# Patient Record
Sex: Female | Born: 1965 | Race: Black or African American | Hispanic: No | Marital: Married | Smoking: Never smoker
Health system: Southern US, Community
[De-identification: ages and names within clinical notes are randomized; demographics above are authoritative.]

## PROBLEM LIST (undated history)

## (undated) DIAGNOSIS — E119 Type 2 diabetes mellitus without complications: Secondary | ICD-10-CM

## (undated) DIAGNOSIS — A6000 Herpesviral infection of urogenital system, unspecified: Secondary | ICD-10-CM

## (undated) DIAGNOSIS — F329 Major depressive disorder, single episode, unspecified: Secondary | ICD-10-CM

## (undated) DIAGNOSIS — F988 Other specified behavioral and emotional disorders with onset usually occurring in childhood and adolescence: Secondary | ICD-10-CM

## (undated) DIAGNOSIS — E669 Obesity, unspecified: Secondary | ICD-10-CM

## (undated) DIAGNOSIS — F32A Depression, unspecified: Secondary | ICD-10-CM

## (undated) DIAGNOSIS — R7303 Prediabetes: Secondary | ICD-10-CM

## (undated) DIAGNOSIS — R011 Cardiac murmur, unspecified: Secondary | ICD-10-CM

## (undated) DIAGNOSIS — F419 Anxiety disorder, unspecified: Secondary | ICD-10-CM

## (undated) HISTORY — PX: COLONOSCOPY: SHX174

## (undated) HISTORY — PX: ABLATION: SHX5711

---
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
## 2006-01-13 ENCOUNTER — Ambulatory Visit: Payer: Self-pay

## 2008-01-31 ENCOUNTER — Ambulatory Visit: Payer: Self-pay | Admitting: Nurse Practitioner

## 2009-04-05 ENCOUNTER — Ambulatory Visit: Payer: Self-pay | Admitting: Nurse Practitioner

## 2011-01-26 ENCOUNTER — Emergency Department: Payer: Self-pay | Admitting: Emergency Medicine

## 2011-10-17 ENCOUNTER — Ambulatory Visit: Payer: Self-pay | Admitting: Internal Medicine

## 2012-01-22 ENCOUNTER — Emergency Department: Payer: Self-pay | Admitting: Emergency Medicine

## 2012-06-15 ENCOUNTER — Ambulatory Visit: Payer: Self-pay | Admitting: Internal Medicine

## 2013-12-27 ENCOUNTER — Ambulatory Visit: Payer: Self-pay | Admitting: Family Medicine

## 2017-04-09 ENCOUNTER — Other Ambulatory Visit: Payer: Self-pay | Admitting: Family Medicine

## 2017-04-09 DIAGNOSIS — Z1231 Encounter for screening mammogram for malignant neoplasm of breast: Secondary | ICD-10-CM

## 2017-04-15 ENCOUNTER — Encounter: Payer: Self-pay | Admitting: Radiology

## 2017-04-15 ENCOUNTER — Ambulatory Visit
Admission: RE | Admit: 2017-04-15 | Discharge: 2017-04-15 | Disposition: A | Discharge status: Home / Self Care | Payer: BC Managed Care – PPO | Source: Ambulatory Visit | Attending: Family Medicine | Admitting: Family Medicine

## 2017-04-15 DIAGNOSIS — Z1231 Encounter for screening mammogram for malignant neoplasm of breast: Secondary | ICD-10-CM | POA: Diagnosis not present

## 2018-03-08 ENCOUNTER — Other Ambulatory Visit: Payer: Self-pay | Admitting: Family Medicine

## 2019-06-08 ENCOUNTER — Other Ambulatory Visit: Payer: Self-pay | Admitting: Family Medicine

## 2019-06-08 DIAGNOSIS — Z1231 Encounter for screening mammogram for malignant neoplasm of breast: Secondary | ICD-10-CM

## 2019-06-16 ENCOUNTER — Other Ambulatory Visit
Admission: RE | Admit: 2019-06-16 | Discharge: 2019-06-16 | Disposition: A | Discharge status: Home / Self Care | Payer: BC Managed Care – PPO | Source: Ambulatory Visit | Attending: Internal Medicine | Admitting: Internal Medicine

## 2019-06-16 ENCOUNTER — Other Ambulatory Visit: Payer: Self-pay

## 2019-06-16 DIAGNOSIS — Z20822 Contact with and (suspected) exposure to covid-19: Secondary | ICD-10-CM | POA: Insufficient documentation

## 2019-06-16 DIAGNOSIS — Z01812 Encounter for preprocedural laboratory examination: Secondary | ICD-10-CM | POA: Diagnosis present

## 2019-06-16 LAB — SARS CORONAVIRUS 2 (TAT 6-24 HRS): SARS Coronavirus 2: NEGATIVE

## 2019-06-17 ENCOUNTER — Encounter: Payer: Self-pay | Admitting: Internal Medicine

## 2019-06-20 ENCOUNTER — Ambulatory Visit: Payer: BC Managed Care – PPO | Admitting: Anesthesiology

## 2019-06-20 ENCOUNTER — Encounter
Admission: RE | Disposition: A | Discharge status: Home / Self Care | Payer: Self-pay | Source: Home / Self Care | Attending: Internal Medicine

## 2019-06-20 ENCOUNTER — Encounter: Payer: Self-pay | Admitting: Internal Medicine

## 2019-06-20 ENCOUNTER — Ambulatory Visit
Admission: RE | Admit: 2019-06-20 | Discharge: 2019-06-20 | Disposition: A | Discharge status: Home / Self Care | Payer: BC Managed Care – PPO | Attending: Internal Medicine | Admitting: Internal Medicine

## 2019-06-20 DIAGNOSIS — R011 Cardiac murmur, unspecified: Secondary | ICD-10-CM | POA: Diagnosis not present

## 2019-06-20 DIAGNOSIS — Z791 Long term (current) use of non-steroidal anti-inflammatories (NSAID): Secondary | ICD-10-CM | POA: Diagnosis not present

## 2019-06-20 DIAGNOSIS — F988 Other specified behavioral and emotional disorders with onset usually occurring in childhood and adolescence: Secondary | ICD-10-CM | POA: Insufficient documentation

## 2019-06-20 DIAGNOSIS — E669 Obesity, unspecified: Secondary | ICD-10-CM | POA: Insufficient documentation

## 2019-06-20 DIAGNOSIS — F419 Anxiety disorder, unspecified: Secondary | ICD-10-CM | POA: Insufficient documentation

## 2019-06-20 DIAGNOSIS — E119 Type 2 diabetes mellitus without complications: Secondary | ICD-10-CM | POA: Diagnosis not present

## 2019-06-20 DIAGNOSIS — Z1211 Encounter for screening for malignant neoplasm of colon: Secondary | ICD-10-CM | POA: Diagnosis not present

## 2019-06-20 DIAGNOSIS — K635 Polyp of colon: Secondary | ICD-10-CM | POA: Diagnosis not present

## 2019-06-20 DIAGNOSIS — K640 First degree hemorrhoids: Secondary | ICD-10-CM | POA: Diagnosis not present

## 2019-06-20 DIAGNOSIS — F329 Major depressive disorder, single episode, unspecified: Secondary | ICD-10-CM | POA: Insufficient documentation

## 2019-06-20 DIAGNOSIS — Z6826 Body mass index (BMI) 26.0-26.9, adult: Secondary | ICD-10-CM | POA: Diagnosis not present

## 2019-06-20 DIAGNOSIS — K5730 Diverticulosis of large intestine without perforation or abscess without bleeding: Secondary | ICD-10-CM | POA: Diagnosis not present

## 2019-06-20 DIAGNOSIS — Z79899 Other long term (current) drug therapy: Secondary | ICD-10-CM | POA: Insufficient documentation

## 2019-06-20 HISTORY — DX: Obesity, unspecified: E66.9

## 2019-06-20 HISTORY — DX: Prediabetes: R73.03

## 2019-06-20 HISTORY — DX: Anxiety disorder, unspecified: F41.9

## 2019-06-20 HISTORY — PX: COLONOSCOPY WITH PROPOFOL: SHX5780

## 2019-06-20 HISTORY — DX: Type 2 diabetes mellitus without complications: E11.9

## 2019-06-20 HISTORY — DX: Other specified behavioral and emotional disorders with onset usually occurring in childhood and adolescence: F98.8

## 2019-06-20 HISTORY — DX: Cardiac murmur, unspecified: R01.1

## 2019-06-20 HISTORY — DX: Herpesviral infection of urogenital system, unspecified: A60.00

## 2019-06-20 HISTORY — DX: Depression, unspecified: F32.A

## 2019-06-20 SURGERY — COLONOSCOPY WITH PROPOFOL
Anesthesia: General

## 2019-06-20 MED ORDER — MIDAZOLAM HCL 2 MG/2ML IJ SOLN
INTRAMUSCULAR | Status: AC
  Filled 2019-06-20: qty 2

## 2019-06-20 MED ORDER — MIDAZOLAM HCL 5 MG/5ML IJ SOLN
INTRAMUSCULAR | Status: DC | PRN
  Administered 2019-06-20: 2 mg via INTRAVENOUS

## 2019-06-20 MED ORDER — PROPOFOL 10 MG/ML IV BOLUS
INTRAVENOUS | Status: DC | PRN
  Administered 2019-06-20: 30 mg via INTRAVENOUS
  Administered 2019-06-20: 70 mg via INTRAVENOUS

## 2019-06-20 MED ORDER — LIDOCAINE 2% (20 MG/ML) 5 ML SYRINGE
INTRAMUSCULAR | Status: DC | PRN
  Administered 2019-06-20: 25 mg via INTRAVENOUS

## 2019-06-20 MED ORDER — SODIUM CHLORIDE 0.9 % IV SOLN
INTRAVENOUS | Status: DC
  Administered 2019-06-20: 1000 mL via INTRAVENOUS

## 2019-06-20 MED ORDER — PROPOFOL 500 MG/50ML IV EMUL
INTRAVENOUS | Status: DC | PRN
  Administered 2019-06-20: 120 ug/kg/min via INTRAVENOUS

## 2019-06-20 NOTE — H&P (Signed)
Outpatient short stay form Pre-procedure 06/20/2019 9:04 AM Christy Carr, M.D.  Primary Physician: Angus Palms, M.D.  Reason for visit:  Colon cancer screening  History of present illness:  Patient presents for colonoscopy for colon cancer screening. The patient denies complaints of abdominal pain, significant change in bowel habits, or rectal bleeding.      Current Facility-Administered Medications:  .  0.9 %  sodium chloride infusion, , Intravenous, Continuous, Dossie Ocanas, Boykin Nearing, MD  Medications Prior to Admission  Medication Sig Dispense Refill Last Dose  . ALPRAZOLAM PO Take 0.5 mg by mouth.   Past Week at Unknown time  . amLODipine (NORVASC) 2.5 MG tablet Take 2.5 mg by mouth daily.   06/19/2019 at Unknown time  . dextroamphetamine (DEXEDRINE SPANSULE) 15 MG 24 hr capsule Take 15 mg by mouth daily.     . diphenhydrAMINE HCl (BENADRYL ALLERGY PO) Take by mouth.   06/19/2019 at Unknown time  . glucose blood test strip 1 each by Other route as needed for other. Use as instructed   06/19/2019 at Unknown time  . telmisartan-hydrochlorothiazide (MICARDIS HCT) 80-25 MG tablet Take 1 tablet by mouth daily.   06/19/2019 at Unknown time  . thiamine (VITAMIN B-1) 100 MG tablet Take 100 mg by mouth daily.   Past Week at Unknown time  . traZODone (DESYREL) 50 MG tablet Take 50 mg by mouth at bedtime.   Past Week at Unknown time  . VITAMIN D, CHOLECALCIFEROL, PO Take 2,000 Units/day by mouth.   Past Week at Unknown time  . vortioxetine HBr (TRINTELLIX) 20 MG TABS tablet Take 20 mg by mouth daily.   06/19/2019 at Unknown time  . atorvastatin (LIPITOR) 10 MG tablet Take 10 mg by mouth daily.   Not Taking at Unknown time  . Diclofenac Sodium (VOLTAREN PO) Take 75 mg by mouth.   Not Taking at Unknown time  . fluticasone (FLONASE) 50 MCG/ACT nasal spray Place 1 spray into both nostrils daily.   Not Taking at Unknown time     No Known Allergies   Past Medical History:  Diagnosis Date  . ADD  (attention deficit disorder)   . Anxiety   . Depression   . Diabetes mellitus without complication (HCC)   . Genital HSV   . Heart murmur   . Obesity   . Pre-diabetes     Review of systems:  Otherwise negative.    Physical Exam  Gen: Alert, oriented. Appears stated age.  HEENT: [place]/AT. PERRLA. Lungs: CTA, no wheezes. CV: RR nl S1, S2. Abd: soft, benign, no masses. BS+ Ext: No edema. Pulses 2+    Planned procedures: Proceed with colonoscopy. The patient understands the nature of the planned procedure, indications, risks, alternatives and potential complications including but not limited to bleeding, infection, perforation, damage to internal organs and possible oversedation/side effects from anesthesia. The patient agrees and gives consent to proceed.  Please refer to procedure notes for findings, recommendations and patient disposition/instructions.     [place] Retana K. Norma Carr, M.D. Gastroenterology 06/20/2019  9:04 AM

## 2019-06-20 NOTE — Transfer of Care (Signed)
Immediate Anesthesia Transfer of Care Note  Patient: Christy Carr  Procedure(s) Performed: COLONOSCOPY WITH PROPOFOL (N/A )  Patient Location: Endoscopy Unit  Anesthesia Type:General  Level of Consciousness: awake  Airway & Oxygen Therapy: Patient Spontanous Breathing  Post-op Assessment: Report given to RN and Post -op Vital signs reviewed and stable  Post vital signs: Reviewed  Last Vitals:  Vitals Value Taken Time  BP 105/71 06/20/19 0938  Temp 36.2 C 06/20/19 0938  Pulse 94 06/20/19 0938  Resp 20 06/20/19 0938  SpO2 97 % 06/20/19 0938  Vitals shown include unvalidated device data.  Last Pain:  Vitals:   06/20/19 0853  TempSrc: Temporal  PainSc: 0-No pain         Complications: No apparent anesthesia complications

## 2019-06-20 NOTE — Op Note (Signed)
[facility] Gastroenterology Patient Name: Christy Carr [facility] Procedure Date: 06/20/2019 8:51 AM MRN: [id number] Account #: [id number] Date of Birth: Nov 09, 1965 Admit Type: Outpatient Age: 54 Room: [facility] ENDO ROOM 3 Gender: Female Note Status: Finalized Procedure:             Colonoscopy Indications:           Screening for colorectal malignant neoplasm Providers:             Boykin Nearing. Norma Fredrickson MD, MD Referring MD:          Marylin Crosby. Greggory Stallion MD, MD (Referring MD) Medicines:             Propofol per Anesthesia Complications:         No immediate complications. Procedure:             Pre-Anesthesia Assessment:                        - The risks and benefits of the procedure and the                         sedation options and risks were discussed with the                         patient. All questions were answered and informed                         consent was obtained.                        - Patient identification and proposed procedure were                         verified prior to the procedure by the nurse. The                         procedure was verified in the procedure room.                        - ASA Grade Assessment: III - A patient with severe                         systemic disease.                        - After reviewing the risks and benefits, the patient                         was deemed in satisfactory condition to undergo the                         procedure.                        After obtaining informed consent, the colonoscope was                         passed under direct vision. Throughout the procedure,                         the patient's  blood pressure, pulse, and oxygen                         saturations were monitored continuously. The                         Colonoscope was introduced through the anus and                         advanced to the the cecum, identified by appendiceal                         orifice and  ileocecal valve. The colonoscopy was                         performed without difficulty. The patient tolerated                         the procedure well. The quality of the bowel                         preparation was good. The ileocecal valve, appendiceal                         orifice, and rectum were photographed. Findings:      The perianal and digital rectal examinations were normal. Pertinent       negatives include normal sphincter tone and no palpable rectal lesions.      A few small-mouthed diverticula were found in the sigmoid colon.      Non-bleeding internal hemorrhoids were found during retroflexion. The       hemorrhoids were Grade I (internal hemorrhoids that do not prolapse).      A 3 mm polyp was found in the distal sigmoid colon. The polyp was       sessile. The polyp was removed with a cold biopsy forceps. Resection and       retrieval were complete.      The exam was otherwise without abnormality on direct and retroflexion       views. Impression:            - Diverticulosis in the sigmoid colon.                        - Non-bleeding internal hemorrhoids.                        - One 3 mm polyp in the distal sigmoid colon, removed                         with a cold biopsy forceps. Resected and retrieved.                        - The examination was otherwise normal on direct and                         retroflexion views. Recommendation:        - Patient has a contact number available for  emergencies. The signs and symptoms of potential                         delayed complications were discussed with the patient.                         Return to normal activities tomorrow. Written                         discharge instructions were provided to the patient.                        - Resume previous diet.                        - Continue present medications.                        - Repeat colonoscopy is recommended for surveillance.                          The colonoscopy date will be determined after                         pathology results from today's exam become available                         for review.                        - Return to GI office PRN.                        - The findings and recommendations were discussed with                         the patient. Procedure Code(s):     --- Professional ---                        [phone], Colonoscopy, flexible; with biopsy, single or                         multiple Diagnosis Code(s):     --- Professional ---                        K57.30, Diverticulosis of large intestine without                         perforation or abscess without bleeding                        K63.5, Polyp of colon                        K64.0, First degree hemorrhoids                        Z12.11, Encounter for screening for malignant neoplasm                         of colon CPT  copyright 2019 American Medical Association. All rights reserved. The codes documented in this report are preliminary and upon coder review may  be revised to meet current compliance requirements. Stanton Kidney MD, MD 06/20/2019 9:36:21 AM This report has been signed electronically. Number of Addenda: 0 Note Initiated On: 06/20/2019 8:51 AM Scope Withdrawal Time: 0 hours 6 minutes 45 seconds  Total Procedure Duration: 0 hours 11 minutes 18 seconds  Estimated Blood Loss:  Estimated blood loss: none.      [facility]

## 2019-06-20 NOTE — Anesthesia Preprocedure Evaluation (Addendum)
Anesthesia Evaluation  Patient identified by MRN, date of birth, ID band Patient awake    Reviewed: Allergy & Precautions, H&P , NPO status , Patient's Chart, lab work & pertinent test results  Airway Mallampati: III  TM Distance: >3 FB Neck ROM: full    Dental  (+) Teeth Intact   Pulmonary neg pulmonary ROS,    breath sounds clear to auscultation       Cardiovascular hypertension, On Medications  Rhythm:regular Rate:Normal     Neuro/Psych PSYCHIATRIC DISORDERS Anxiety Depression negative neurological ROS     GI/Hepatic negative GI ROS, Neg liver ROS,   Endo/Other  diabetes  Renal/GU negative Renal ROS  negative genitourinary   Musculoskeletal   Abdominal   Peds  Hematology negative hematology ROS (+)   Anesthesia Other Findings Past Medical History: No date: ADD (attention deficit disorder) No date: Anxiety No date: Depression No date: Diabetes mellitus without complication (HCC) No date: Genital HSV No date: Heart murmur No date: Obesity  Past Surgical History: No date: ABLATION; N/A No date: CESAREAN SECTION; N/A No date: COLONOSCOPY; N/A     Reproductive/Obstetrics negative OB ROS                           Anesthesia Physical Anesthesia Plan  ASA: II  Anesthesia Plan: General   Post-op Pain Management:    Induction:   PONV Risk Score and Plan: Propofol infusion and TIVA  Airway Management Planned: Natural Airway and Nasal Cannula  Additional Equipment:   Intra-op Plan:   Post-operative Plan:   Informed Consent: I have reviewed the patients History and Physical, chart, labs and discussed the procedure including the risks, benefits and alternatives for the proposed anesthesia with the patient or authorized representative who has indicated his/her understanding and acceptance.     Dental Advisory Given  Plan Discussed with: Anesthesiologist  Anesthesia Plan  Comments:         Anesthesia Quick Evaluation

## 2019-06-20 NOTE — Anesthesia Postprocedure Evaluation (Signed)
Anesthesia Post Note  Patient: Christy Carr  Procedure(s) Performed: COLONOSCOPY WITH PROPOFOL (N/A )  Patient location during evaluation: PACU Anesthesia Type: General Level of consciousness: awake and alert Pain management: pain level controlled Vital Signs Assessment: post-procedure vital signs reviewed and stable Respiratory status: spontaneous breathing, nonlabored ventilation and respiratory function stable Cardiovascular status: blood pressure returned to baseline and stable Postop Assessment: no apparent nausea or vomiting Anesthetic complications: no     Last Vitals:  Vitals:   06/20/19 0853 06/20/19 0938  BP: 132/87 105/71  Pulse: 89 90  Resp: 18 16  Temp: (!) 36 C (!) 36.2 C  SpO2: 98% 97%    Last Pain:  Vitals:   06/20/19 1018  TempSrc:   PainSc: 0-No pain                 Karleen [place]

## 2019-06-20 NOTE — Interval H&P Note (Signed)
History and Physical Interval Note:  06/20/2019 9:05 AM  Christy Carr  has presented today for surgery, with the diagnosis of CCA.  The various methods of treatment have been discussed with the patient and family. After consideration of risks, benefits and other options for treatment, the patient has consented to  Procedure(s): COLONOSCOPY WITH PROPOFOL (N/A) as a surgical intervention.  The patient's history has been reviewed, patient examined, no change in status, stable for surgery.  I have reviewed the patient's chart and labs.  Questions were answered to the patient's satisfaction.     [place], [place]

## 2019-06-21 ENCOUNTER — Encounter: Payer: Self-pay | Admitting: *Deleted

## 2019-06-21 LAB — SURGICAL PATHOLOGY

## 2019-06-27 ENCOUNTER — Ambulatory Visit
Admission: RE | Admit: 2019-06-27 | Discharge: 2019-06-27 | Disposition: A | Discharge status: Home / Self Care | Payer: BC Managed Care – PPO | Source: Ambulatory Visit | Attending: Family Medicine | Admitting: Family Medicine

## 2019-06-27 DIAGNOSIS — Z1231 Encounter for screening mammogram for malignant neoplasm of breast: Secondary | ICD-10-CM | POA: Diagnosis not present

## 2020-03-26 ENCOUNTER — Other Ambulatory Visit: Payer: BC Managed Care – PPO

## 2020-03-26 DIAGNOSIS — Z20822 Contact with and (suspected) exposure to covid-19: Secondary | ICD-10-CM

## 2020-03-28 LAB — NOVEL CORONAVIRUS, NAA: SARS-CoV-2, NAA: NOT DETECTED

## 2020-03-28 LAB — SARS-COV-2, NAA 2 DAY TAT

## 2020-09-12 ENCOUNTER — Other Ambulatory Visit: Payer: Self-pay | Admitting: Family Medicine

## 2020-09-12 DIAGNOSIS — Z1231 Encounter for screening mammogram for malignant neoplasm of breast: Secondary | ICD-10-CM

## 2020-09-25 ENCOUNTER — Ambulatory Visit
Admission: RE | Admit: 2020-09-25 | Discharge: 2020-09-25 | Disposition: A | Discharge status: Home / Self Care | Payer: BC Managed Care – PPO | Source: Ambulatory Visit | Attending: Family Medicine | Admitting: Family Medicine

## 2020-09-25 ENCOUNTER — Other Ambulatory Visit: Payer: Self-pay

## 2020-09-25 DIAGNOSIS — Z1231 Encounter for screening mammogram for malignant neoplasm of breast: Secondary | ICD-10-CM | POA: Insufficient documentation

## 2021-09-14 ENCOUNTER — Emergency Department: Payer: BC Managed Care – PPO

## 2021-09-14 ENCOUNTER — Emergency Department
Admission: EM | Admit: 2021-09-14 | Discharge: 2021-09-14 | Disposition: A | Discharge status: Home / Self Care | Payer: BC Managed Care – PPO | Attending: Emergency Medicine | Admitting: Emergency Medicine

## 2021-09-14 ENCOUNTER — Other Ambulatory Visit: Payer: Self-pay

## 2021-09-14 DIAGNOSIS — Z79899 Other long term (current) drug therapy: Secondary | ICD-10-CM | POA: Diagnosis not present

## 2021-09-14 DIAGNOSIS — R519 Headache, unspecified: Secondary | ICD-10-CM | POA: Diagnosis present

## 2021-09-14 DIAGNOSIS — Z96651 Presence of right artificial knee joint: Secondary | ICD-10-CM | POA: Insufficient documentation

## 2021-09-14 DIAGNOSIS — E119 Type 2 diabetes mellitus without complications: Secondary | ICD-10-CM | POA: Insufficient documentation

## 2021-09-14 NOTE — ED Provider Notes (Signed)
[facility] Provider Note    Event Date/Time   First MD Initiated Contact with Patient 09/14/21 [phone]     (approximate)   History   No chief complaint on file.   HPI  Christy Carr is a 56 y.o. female with a medical history that includes diabetes, depression, anxiety, morbid obesity, and recent right knee replacement surgery.  She presents tonight for evaluation of headache and a specific point at the top of the left side of her head near the forehead.  She said that it started a couple days ago and then went away but then came back.  She said that she has felt a little bit strange this week, but she realized it is because she did not start any of her regular medications after going off of them for her surgery.  She started taking her medications again, including her blood pressure medicines, Adderall, etc., but she is still having this sharp intermittent pain in the top front left part of her head, so she wanted to get it checked out.  She has no numbness nor weakness in her extremities other than as a result of the right knee surgery.  No chest pain or shortness of breath.  No palpitations.     Physical Exam   Triage Vital Signs: ED Triage Vitals  Enc Vitals Group     BP 09/14/21 0129 (!) 143/101     Pulse Rate 09/14/21 0129 (!) 105     Resp 09/14/21 0129 20     Temp 09/14/21 0129 99.4 F (37.4 C)     Temp Source 09/14/21 0129 Oral     SpO2 09/14/21 0129 95 %     Weight 09/14/21 0136 107.5 kg (236 lb 15.9 oz)     Height 09/14/21 0136 1.6 m (5\' 3" )     Head Circumference --      Peak Flow --      Pain Score 09/14/21 0135 3     Pain Loc --      Pain Edu? --      Excl. in GC? --     Most recent vital signs: Vitals:   09/14/21 0518 09/14/21 0721  BP: 109/66 116/78  Pulse: 89 88  Resp: 18 16  Temp: 98.3 F (36.8 C)   SpO2: 93% 99%     General: Awake, no distress.  Generally well-appearing. CV:  Good peripheral perfusion.   Resp:  Normal effort.  Abd:  No distention.  Other:  Postoperative right knee, appropriate for recent surgery. Eyes:  Pupils are equal and reactive, extraocular movement is intact.   ED Results / Procedures / Treatments   Labs (all labs ordered are listed, but only abnormal results are displayed) Labs Reviewed - No data to display   RADIOLOGY I viewed and interpreted the patient's head CT and two-view chest x-ray.  No acute abnormalities including no neoplasm, intracranial bleed, or pneumonia on chest x-ray.  Insert normal report    PROCEDURES:  Critical Care performed: No  Procedures   MEDICATIONS ORDERED IN ED: Medications - No data to display   IMPRESSION / MDM / ASSESSMENT AND PLAN / ED COURSE  I reviewed the triage vital signs and the nursing notes.                              Differential diagnosis includes, but is not limited to, nonspecific headache, medication/drug side effect (or  withdrawal effects from not taking her medicines), stress, metabolic or electrolyte abnormality, pneumonia.  Patient's presentation is most consistent with acute presentation with potential threat to life or bodily function.  Vital signs are stable within normal limits.  Patient is well-appearing and in no distress.  Her only acute symptom at this time is very specific pain in the frontal part of her head as described above.  Fortunately she is neurologically intact.  CT is normal as described above.  Chest x-ray ordered in triage is also normal but she has no cardiac or thoracic complaints at this time.  I provided reassurance and encouraged her to continue taking the medications, because I think it is likely that she is felt this week because she was not taking her regular medications until just yesterday.  She agrees with the plan and feels reassured.  I gave my usual and customary return precautions.  No indication for admission or further evaluation at this time.          FINAL CLINICAL IMPRESSION(S) / ED DIAGNOSES   Final diagnoses:  Acute nonintractable headache, unspecified headache type     Rx / DC Orders   ED Discharge Orders     None        Note:  This document was prepared using Dragon voice recognition software and may include unintentional dictation errors.   Loleta Rose, MD 09/14/21 [phone]

## 2021-09-14 NOTE — ED Triage Notes (Signed)
Pt presents to ER from home c/o pain to a single spot in the top of her head.  Pt states she had right knee replacement surgery on 7/14 without any post-op complications.  Pt states she normally takes adderall BID and had been off of it for appx 1 week, starting back again today.  Pt is A&O x4 at this time in NAD in triage.

## 2021-09-14 NOTE — Discharge Instructions (Addendum)
Your workup in the Emergency Department today was reassuring.  We did not find any specific abnormalities.  We recommend you drink plenty of fluids, take your regular medications and/or any new ones prescribed today, and follow up with the doctor(s) listed in these documents as recommended.  Return to the Emergency Department if you develop new or worsening symptoms that concern you.  

## 2021-10-16 IMAGING — MG MM DIGITAL SCREENING BILAT W/ TOMO AND CAD
6 of 12 series · 6 of 36 positions shown · non-contrast
Comparison: Previous exam(s).

CLINICAL DATA: Screening.

EXAM:
DIGITAL SCREENING BILATERAL MAMMOGRAM WITH TOMOSYNTHESIS AND CAD
TECHNIQUE: Bilateral screening digital craniocaudal and mediolateral oblique
mammograms were obtained. Bilateral screening digital breast
tomosynthesis was performed. The images were evaluated with
computer-aided detection.

[R CC synth-2D (1 of 2)]
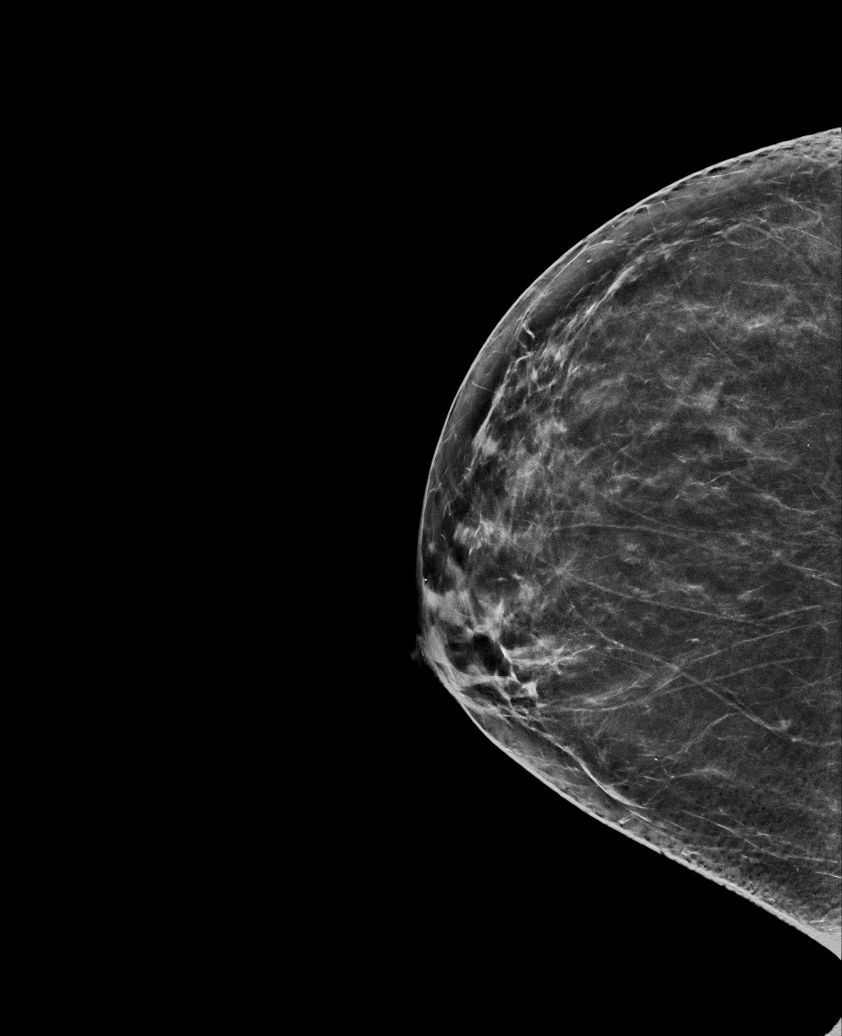

[L CC synth-2D (1 of 2)]
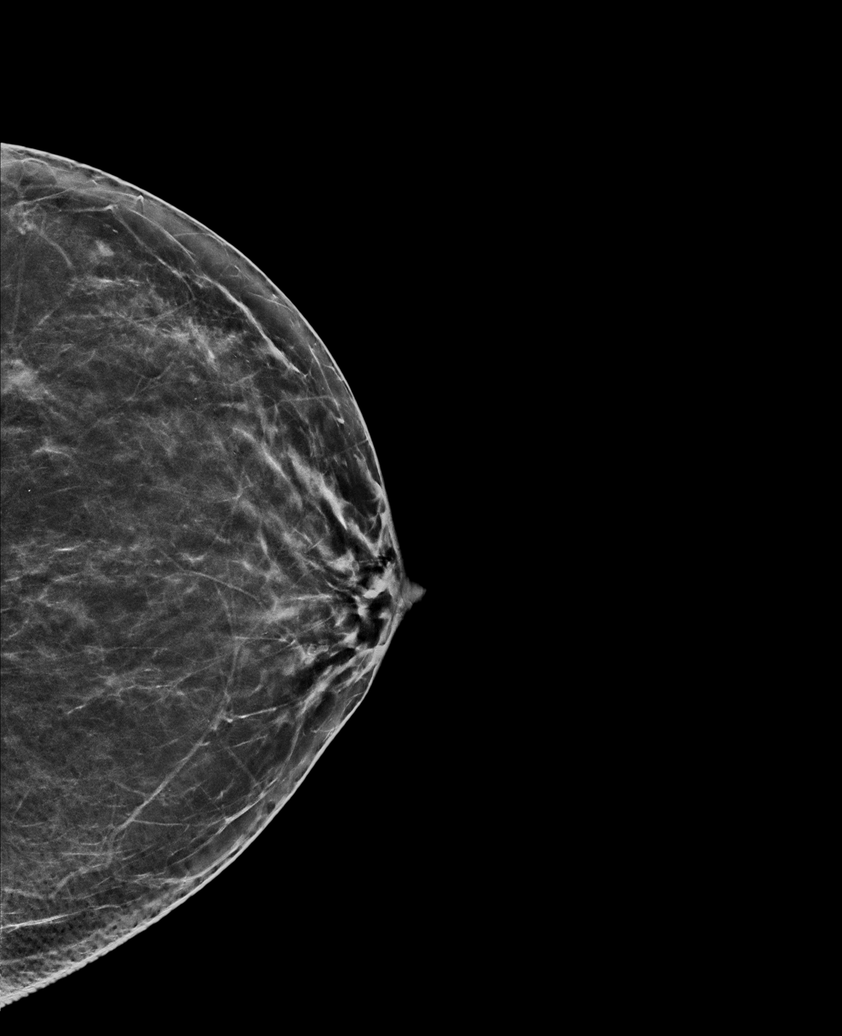

[R CC synth-2D (2 of 2)]
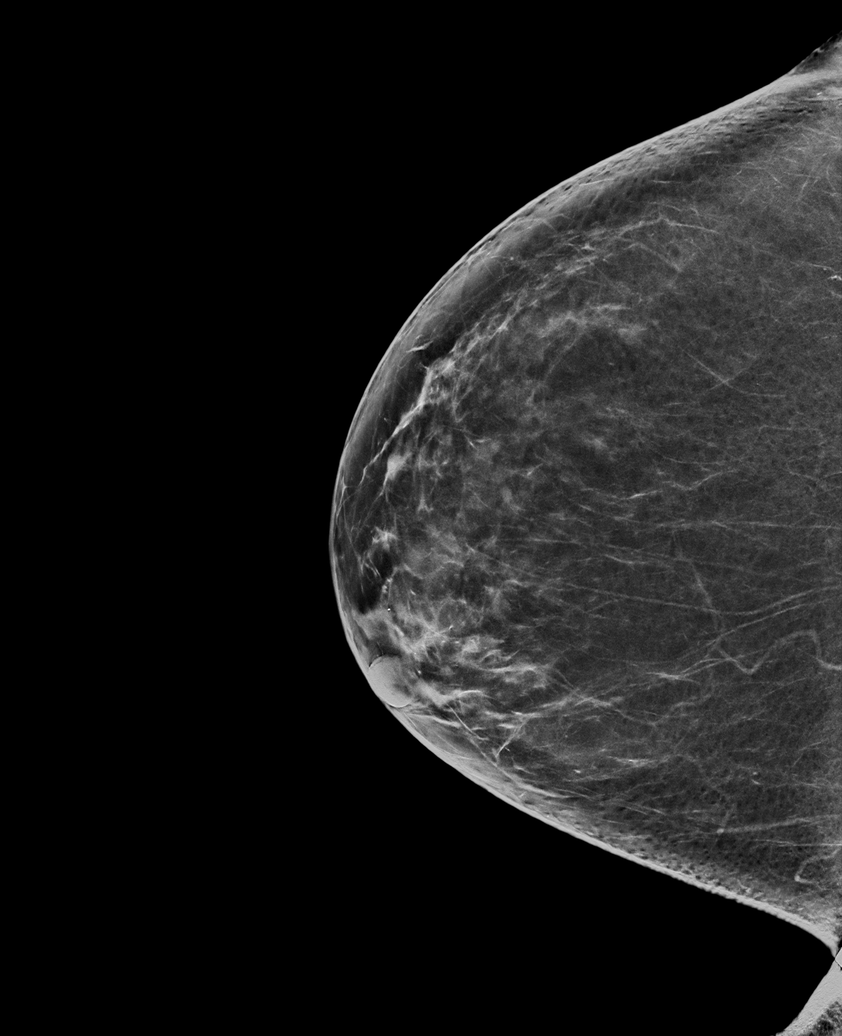

[L MLO synth-2D]
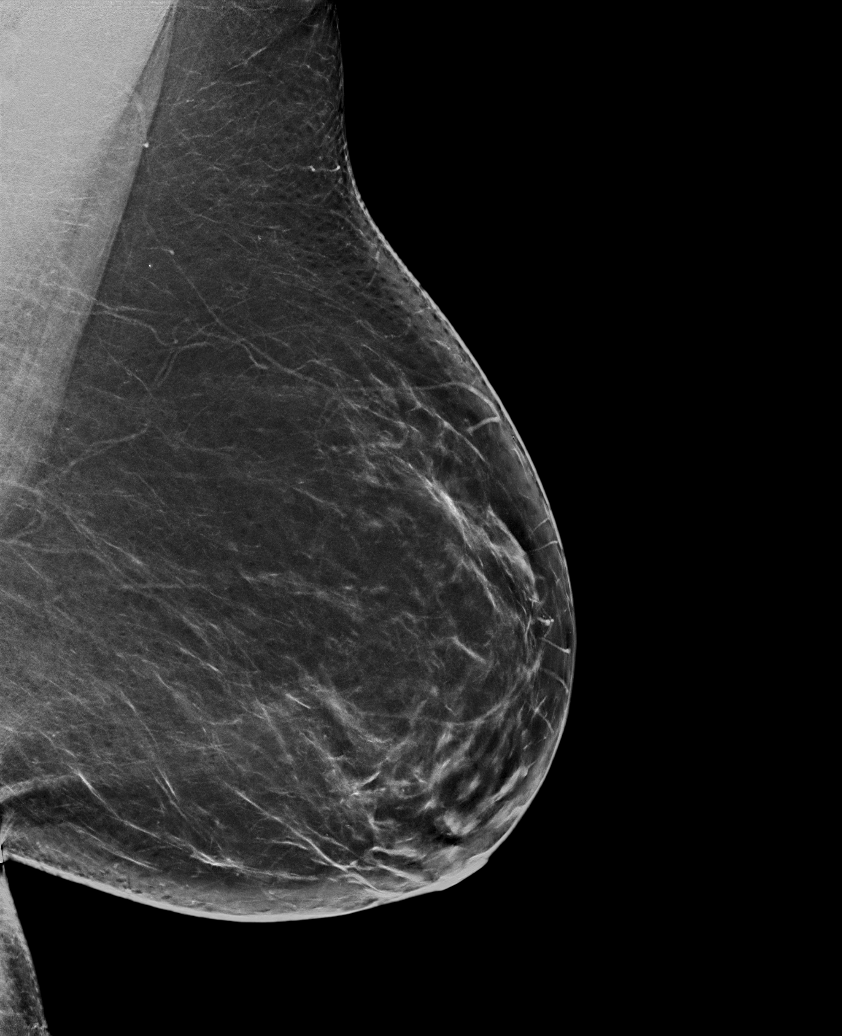

[L CC synth-2D (2 of 2)]
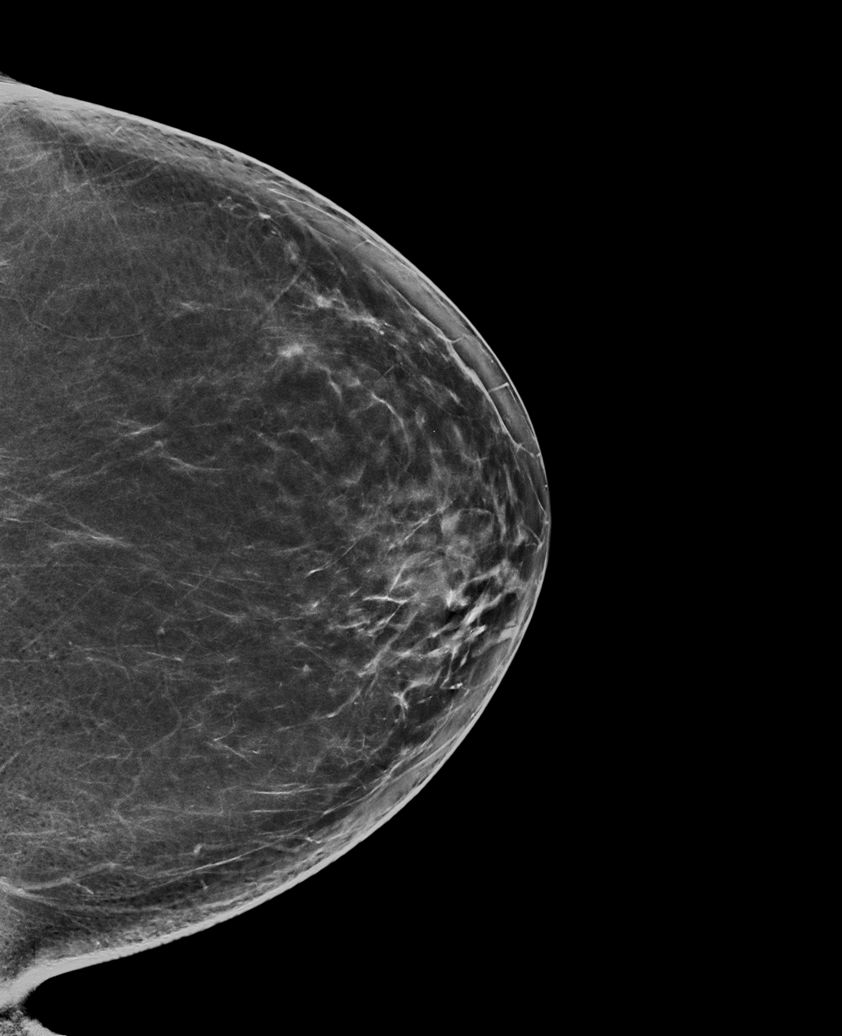

[R MLO synth-2D]
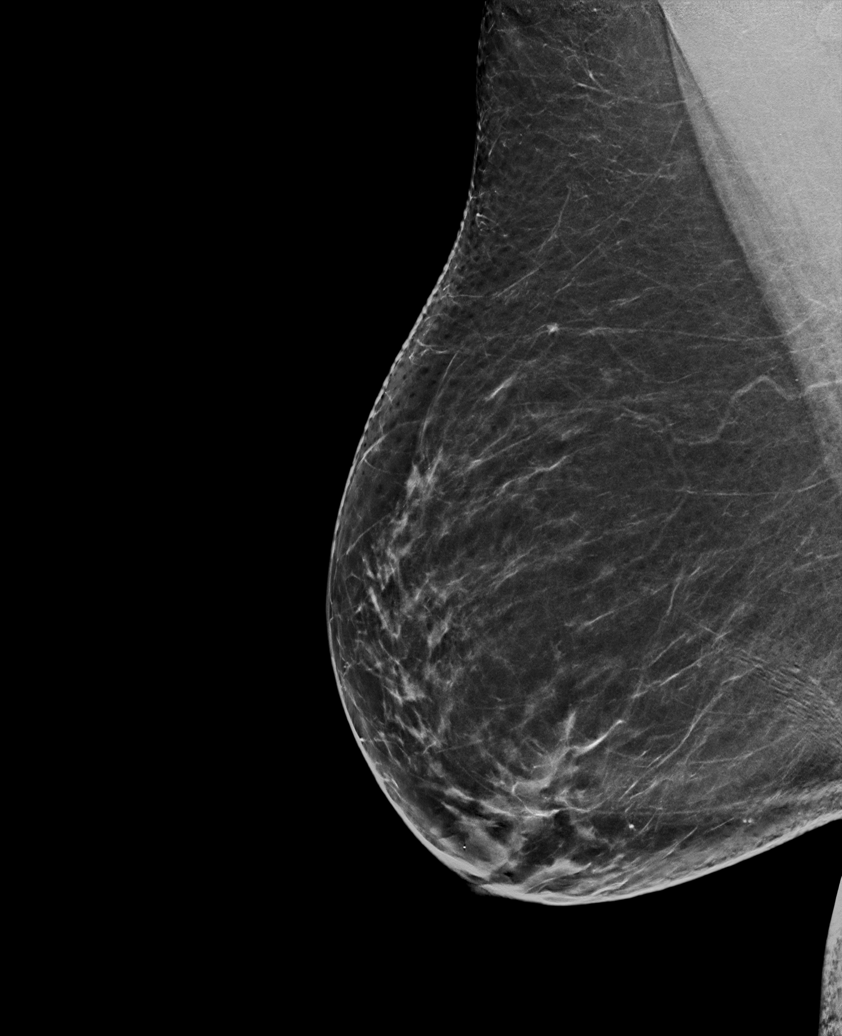

[6 of 36 positions shown; findings below may reference images not displayed]

ACR Breast Density Category b: There are scattered areas of
fibroglandular density.
FINDINGS: There are no findings suspicious for malignancy.
IMPRESSION: No mammographic evidence of malignancy. A result letter of this
screening mammogram will be mailed directly to the patient.

RECOMMENDATION:
Screening mammogram in one year. (Code:51-O-LD2)

BI-RADS CATEGORY  1: Negative.

## 2021-11-08 ENCOUNTER — Other Ambulatory Visit: Payer: Self-pay | Admitting: Family Medicine

## 2021-11-08 DIAGNOSIS — Z1231 Encounter for screening mammogram for malignant neoplasm of breast: Secondary | ICD-10-CM

## 2021-11-27 ENCOUNTER — Ambulatory Visit
Admission: RE | Admit: 2021-11-27 | Discharge: 2021-11-27 | Disposition: A | Discharge status: Home / Self Care | Payer: BC Managed Care – PPO | Source: Ambulatory Visit | Attending: Family Medicine | Admitting: Family Medicine

## 2021-11-27 DIAGNOSIS — Z1231 Encounter for screening mammogram for malignant neoplasm of breast: Secondary | ICD-10-CM | POA: Diagnosis present

## 2024-01-26 ENCOUNTER — Other Ambulatory Visit: Payer: Self-pay | Admitting: Family Medicine

## 2024-01-26 DIAGNOSIS — Z1231 Encounter for screening mammogram for malignant neoplasm of breast: Secondary | ICD-10-CM

## 2024-03-02 ENCOUNTER — Ambulatory Visit: Payer: Self-pay
